# Patient Record
Sex: Female | Born: 1955 | Race: White | Hispanic: No | Marital: Married | Smoking: Never smoker
Health system: Southern US, Community
[De-identification: ages and names within clinical notes are randomized; demographics above are authoritative.]

## PROBLEM LIST (undated history)

## (undated) DIAGNOSIS — I219 Acute myocardial infarction, unspecified: Secondary | ICD-10-CM

---

## 2004-07-23 ENCOUNTER — Emergency Department: Payer: Self-pay | Admitting: Unknown Physician Specialty

## 2007-01-24 ENCOUNTER — Ambulatory Visit: Payer: Self-pay | Admitting: Gastroenterology

## 2007-02-14 ENCOUNTER — Ambulatory Visit: Payer: Self-pay | Admitting: Gastroenterology

## 2008-01-30 ENCOUNTER — Ambulatory Visit: Payer: Self-pay | Admitting: Unknown Physician Specialty

## 2008-05-18 ENCOUNTER — Emergency Department: Payer: Self-pay

## 2008-11-24 ENCOUNTER — Ambulatory Visit: Payer: Self-pay | Admitting: Urology

## 2009-02-16 ENCOUNTER — Ambulatory Visit: Payer: Self-pay | Admitting: Internal Medicine

## 2009-09-06 ENCOUNTER — Ambulatory Visit: Payer: Self-pay | Admitting: Internal Medicine

## 2009-11-23 ENCOUNTER — Ambulatory Visit: Payer: Self-pay | Admitting: General Practice

## 2010-02-17 ENCOUNTER — Ambulatory Visit: Payer: Self-pay | Admitting: Unknown Physician Specialty

## 2010-03-07 ENCOUNTER — Ambulatory Visit: Payer: Self-pay | Admitting: Internal Medicine

## 2010-06-15 DIAGNOSIS — I2510 Atherosclerotic heart disease of native coronary artery without angina pectoris: Secondary | ICD-10-CM | POA: Insufficient documentation

## 2010-06-15 DIAGNOSIS — E039 Hypothyroidism, unspecified: Secondary | ICD-10-CM | POA: Insufficient documentation

## 2010-06-15 DIAGNOSIS — I219 Acute myocardial infarction, unspecified: Secondary | ICD-10-CM | POA: Insufficient documentation

## 2010-07-04 ENCOUNTER — Ambulatory Visit: Payer: Self-pay | Admitting: Internal Medicine

## 2010-07-10 ENCOUNTER — Emergency Department: Payer: Self-pay | Admitting: Emergency Medicine

## 2010-11-29 ENCOUNTER — Ambulatory Visit: Payer: Self-pay | Admitting: Urology

## 2011-01-24 ENCOUNTER — Ambulatory Visit: Payer: Self-pay | Admitting: Family Medicine

## 2011-07-25 ENCOUNTER — Ambulatory Visit: Payer: Self-pay | Admitting: Internal Medicine

## 2015-12-31 DIAGNOSIS — H9313 Tinnitus, bilateral: Secondary | ICD-10-CM | POA: Insufficient documentation

## 2017-05-30 ENCOUNTER — Other Ambulatory Visit: Payer: Self-pay | Admitting: Pediatrics

## 2017-05-30 DIAGNOSIS — Z1231 Encounter for screening mammogram for malignant neoplasm of breast: Secondary | ICD-10-CM

## 2017-06-06 ENCOUNTER — Encounter: Payer: Self-pay | Admitting: Radiology

## 2017-06-06 ENCOUNTER — Ambulatory Visit
Admission: RE | Admit: 2017-06-06 | Discharge: 2017-06-06 | Disposition: A | Discharge status: Home / Self Care | Source: Ambulatory Visit | Attending: Pediatrics | Admitting: Pediatrics

## 2017-06-06 DIAGNOSIS — Z1231 Encounter for screening mammogram for malignant neoplasm of breast: Secondary | ICD-10-CM

## 2017-06-07 ENCOUNTER — Other Ambulatory Visit: Payer: Self-pay | Admitting: *Deleted

## 2017-06-07 ENCOUNTER — Inpatient Hospital Stay
Admission: RE | Admit: 2017-06-07 | Discharge: 2017-06-07 | Disposition: A | Discharge status: Home / Self Care | Payer: Self-pay | Source: Ambulatory Visit | Attending: *Deleted | Admitting: *Deleted

## 2017-06-07 DIAGNOSIS — Z9289 Personal history of other medical treatment: Secondary | ICD-10-CM

## 2017-09-05 ENCOUNTER — Ambulatory Visit
Admission: EM | Admit: 2017-09-05 | Discharge: 2017-09-05 | Disposition: A | Discharge status: Home / Self Care | Attending: Family Medicine | Admitting: Family Medicine

## 2017-09-05 ENCOUNTER — Ambulatory Visit (INDEPENDENT_AMBULATORY_CARE_PROVIDER_SITE_OTHER)

## 2017-09-05 DIAGNOSIS — T310 Burns involving less than 10% of body surface: Secondary | ICD-10-CM

## 2017-09-05 DIAGNOSIS — X101XXA Contact with hot food, initial encounter: Secondary | ICD-10-CM

## 2017-09-05 DIAGNOSIS — T2102XA Burn of unspecified degree of abdominal wall, initial encounter: Secondary | ICD-10-CM

## 2017-09-05 DIAGNOSIS — S99922A Unspecified injury of left foot, initial encounter: Secondary | ICD-10-CM

## 2017-09-05 DIAGNOSIS — M79672 Pain in left foot: Secondary | ICD-10-CM

## 2017-09-05 DIAGNOSIS — T25022A Burn of unspecified degree of left foot, initial encounter: Secondary | ICD-10-CM

## 2017-09-05 HISTORY — DX: Acute myocardial infarction, unspecified: I21.9

## 2017-09-05 MED ORDER — KETOROLAC TROMETHAMINE 60 MG/2ML IM SOLN
60.0000 mg | Freq: Once | INTRAMUSCULAR | Status: AC
  Administered 2017-09-05: 60 mg via INTRAMUSCULAR

## 2017-09-05 MED ORDER — SILVER SULFADIAZINE 1 % EX CREA
TOPICAL_CREAM | Freq: Two times a day (BID) | CUTANEOUS | Status: DC
  Administered 2017-09-05: 20:00:00 via TOPICAL

## 2017-09-05 MED ORDER — TRAMADOL HCL 50 MG PO TABS: 50.0000 mg | ORAL_TABLET | Freq: Four times a day (QID) | ORAL | 0 refills | Status: AC | PRN

## 2017-09-05 MED ORDER — SILVER SULFADIAZINE 1 % EX CREA: 1.0000 "application " | TOPICAL_CREAM | Freq: Every day | CUTANEOUS | 0 refills | Status: AC
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
## 2018-05-15 ENCOUNTER — Other Ambulatory Visit: Payer: Self-pay | Admitting: Pediatrics

## 2018-05-15 DIAGNOSIS — Z1231 Encounter for screening mammogram for malignant neoplasm of breast: Secondary | ICD-10-CM

## 2018-06-10 ENCOUNTER — Encounter (INDEPENDENT_AMBULATORY_CARE_PROVIDER_SITE_OTHER): Payer: Self-pay

## 2018-06-10 ENCOUNTER — Ambulatory Visit
Admission: RE | Admit: 2018-06-10 | Discharge: 2018-06-10 | Disposition: A | Discharge status: Home / Self Care | Source: Ambulatory Visit | Attending: Pediatrics | Admitting: Pediatrics

## 2018-06-10 DIAGNOSIS — Z1231 Encounter for screening mammogram for malignant neoplasm of breast: Secondary | ICD-10-CM | POA: Diagnosis not present

## 2018-06-11 ENCOUNTER — Other Ambulatory Visit: Payer: Self-pay | Admitting: Pediatrics

## 2018-06-13 ENCOUNTER — Other Ambulatory Visit: Payer: Self-pay | Admitting: Pediatrics

## 2018-06-13 DIAGNOSIS — N6320 Unspecified lump in the left breast, unspecified quadrant: Secondary | ICD-10-CM

## 2018-06-13 DIAGNOSIS — R928 Other abnormal and inconclusive findings on diagnostic imaging of breast: Secondary | ICD-10-CM

## 2018-06-20 ENCOUNTER — Ambulatory Visit
Admission: RE | Admit: 2018-06-20 | Discharge: 2018-06-20 | Disposition: A | Discharge status: Home / Self Care | Source: Ambulatory Visit | Attending: Pediatrics | Admitting: Pediatrics

## 2018-06-20 DIAGNOSIS — R928 Other abnormal and inconclusive findings on diagnostic imaging of breast: Secondary | ICD-10-CM | POA: Insufficient documentation

## 2018-06-20 DIAGNOSIS — N6320 Unspecified lump in the left breast, unspecified quadrant: Secondary | ICD-10-CM | POA: Diagnosis present

## 2019-06-17 ENCOUNTER — Other Ambulatory Visit: Payer: Self-pay | Admitting: Pediatrics

## 2019-06-26 ENCOUNTER — Other Ambulatory Visit: Payer: Self-pay | Admitting: Pediatrics

## 2019-06-26 DIAGNOSIS — Z1231 Encounter for screening mammogram for malignant neoplasm of breast: Secondary | ICD-10-CM

## 2019-07-07 ENCOUNTER — Encounter (INDEPENDENT_AMBULATORY_CARE_PROVIDER_SITE_OTHER): Payer: Self-pay

## 2019-07-07 ENCOUNTER — Ambulatory Visit
Admission: RE | Admit: 2019-07-07 | Discharge: 2019-07-07 | Disposition: A | Discharge status: Home / Self Care | Source: Ambulatory Visit | Attending: Pediatrics | Admitting: Pediatrics

## 2019-07-07 ENCOUNTER — Other Ambulatory Visit: Payer: Self-pay

## 2019-07-07 DIAGNOSIS — Z1231 Encounter for screening mammogram for malignant neoplasm of breast: Secondary | ICD-10-CM

## 2020-04-18 IMAGING — MG DIGITAL SCREENING BILATERAL MAMMOGRAM WITH TOMO AND CAD
8 series · 8 of 24 positions shown · non-contrast
Comparison: Previous exam(s).

CLINICAL DATA: Screening.

EXAM:
DIGITAL SCREENING BILATERAL MAMMOGRAM WITH TOMO AND CAD

[L CC synth-2D]
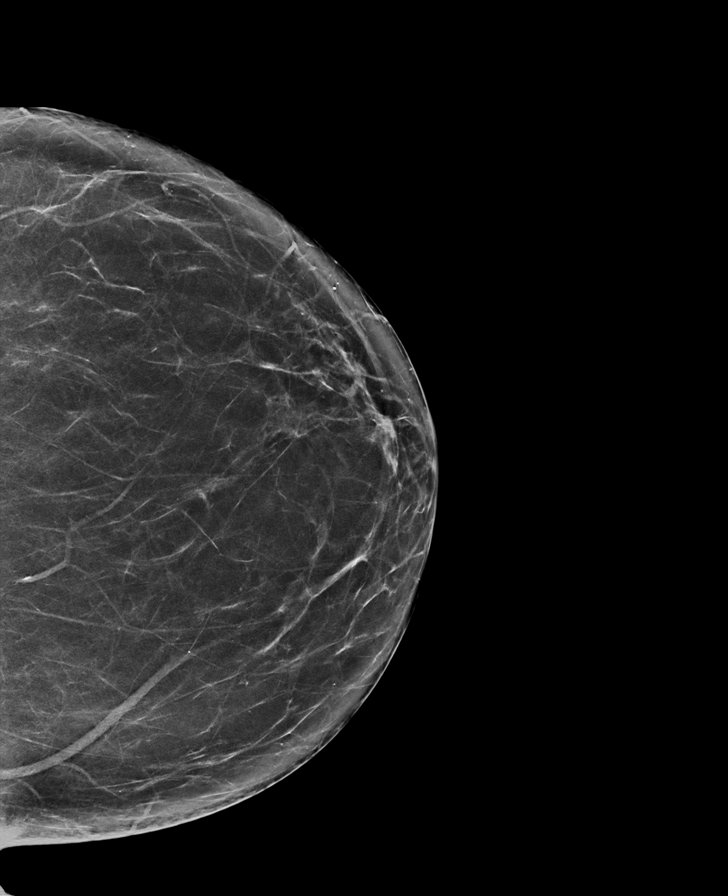

[R MLO synth-2D]
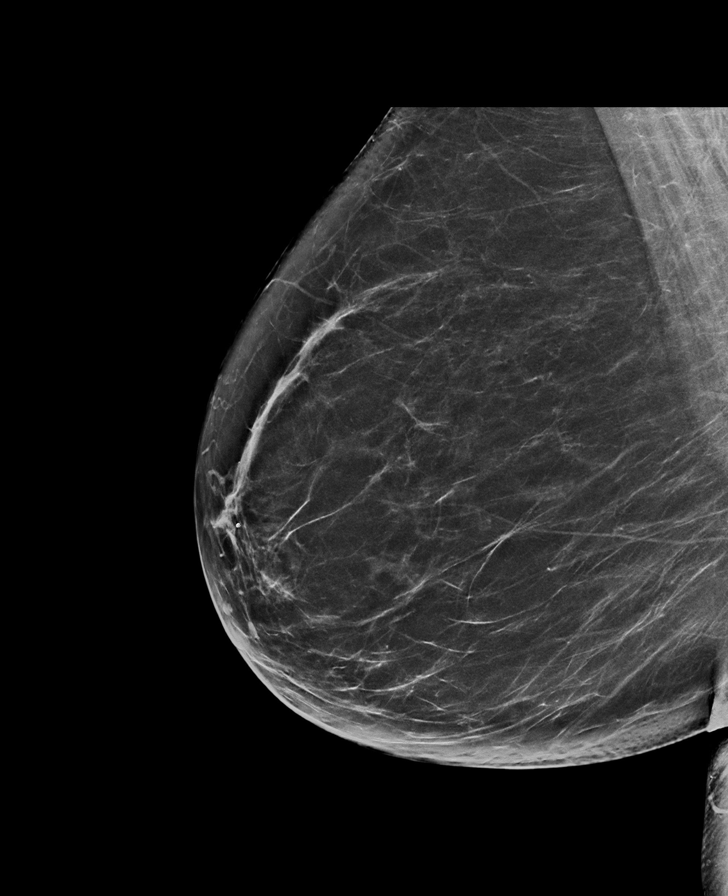

[L MLO synth-2D]
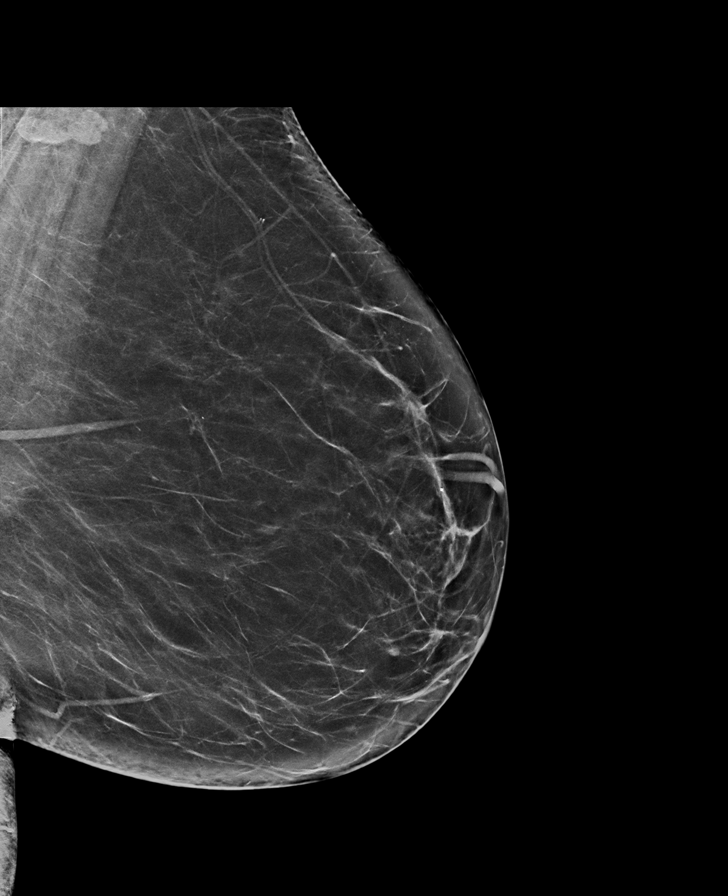

[R CC synth-2D]
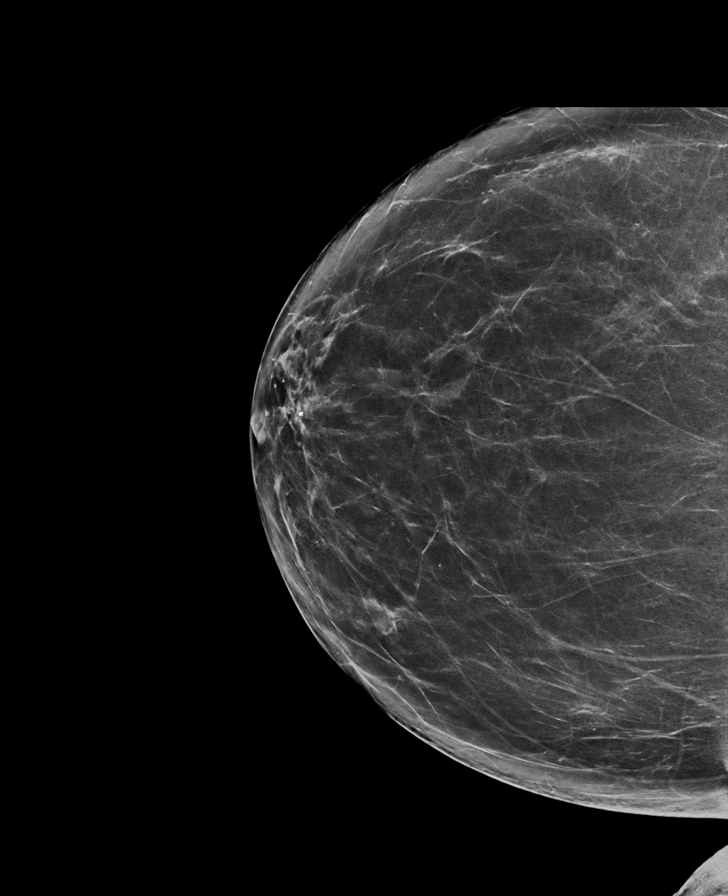

[L CC tomo · tomo slice 37/72.0]
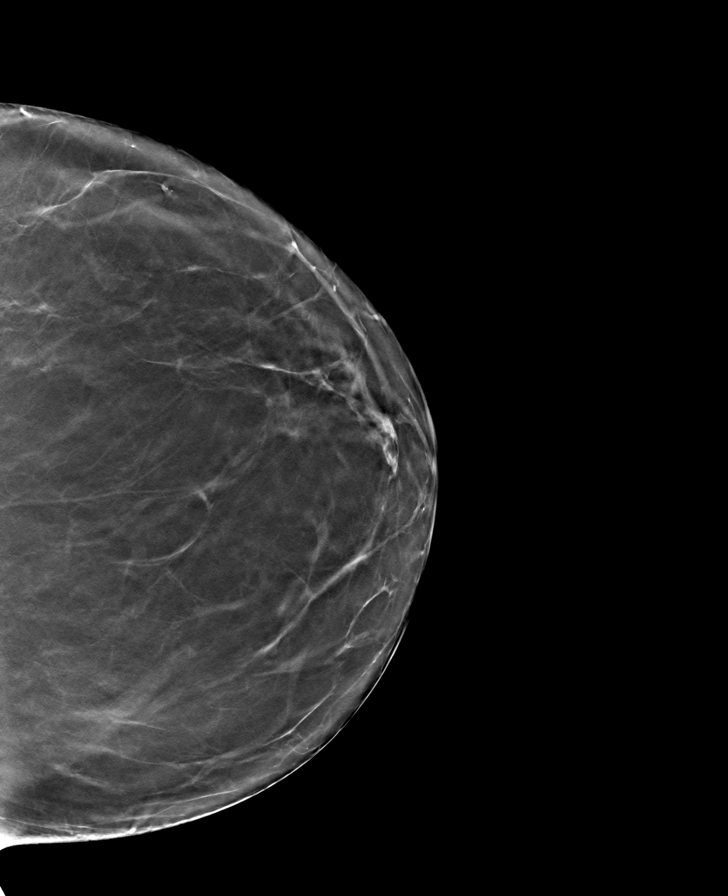

[R MLO tomo · tomo slice 41/82.0]
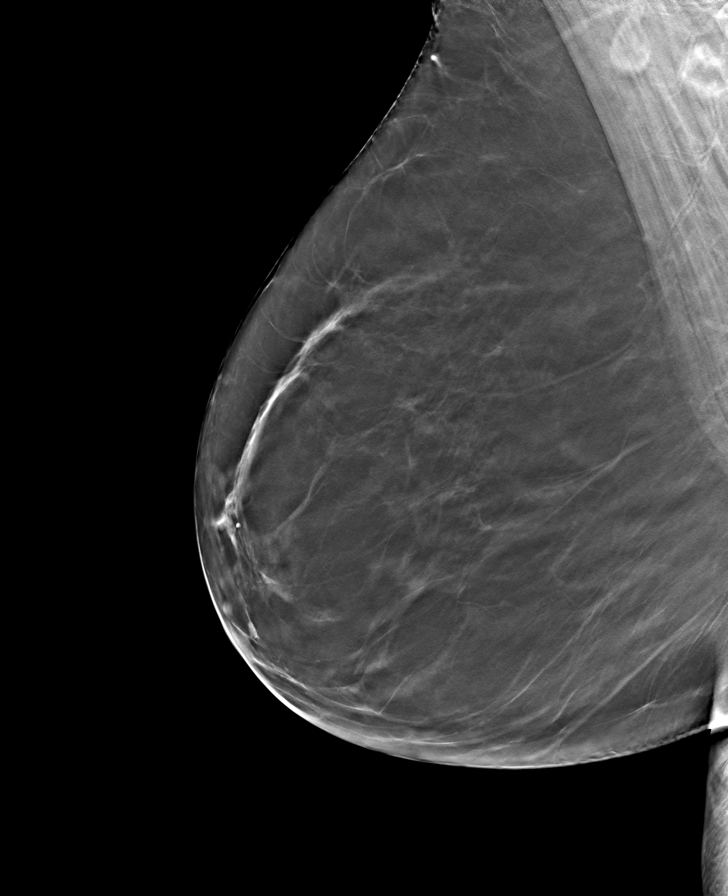

[R CC tomo · tomo slice 39/78.0]
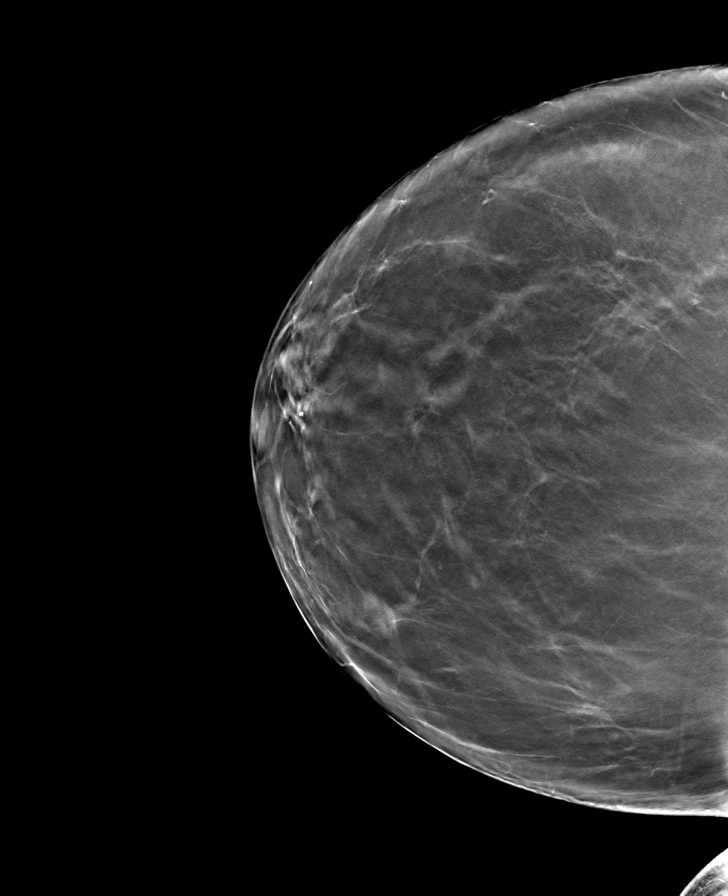

[L MLO tomo · tomo slice 40/79.0]
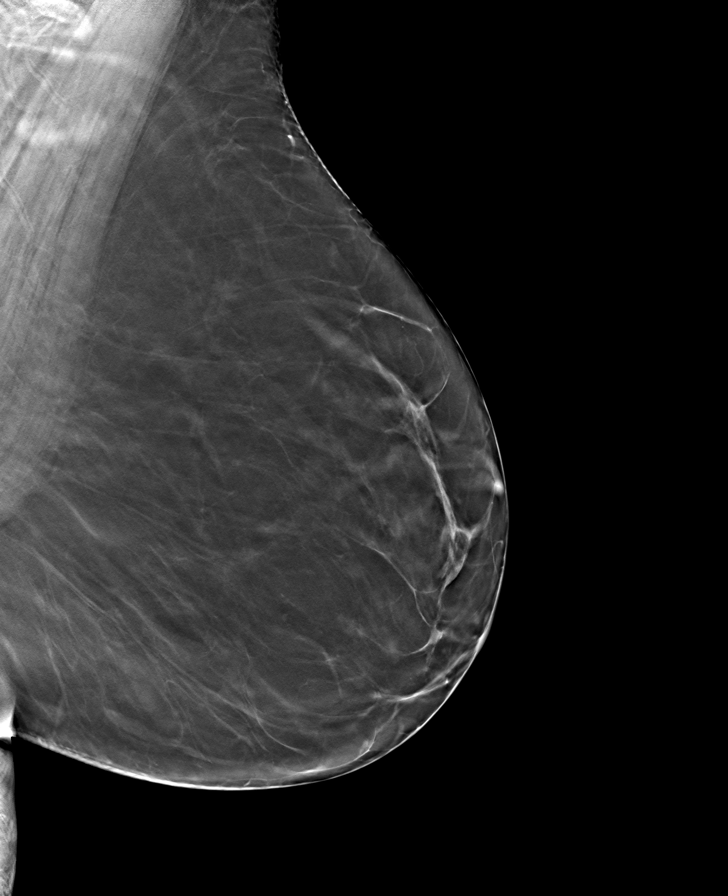

[8 of 24 positions shown; findings below may reference images not displayed]

ACR Breast Density Category b: There are scattered areas of
fibroglandular density.
FINDINGS: In the left breast, a possible mass warrants further evaluation. In
the right breast, no findings suspicious for malignancy. Images were
processed with CAD.
IMPRESSION: Further evaluation is suggested for possible mass in the left
breast.

RECOMMENDATION:
Ultrasound of the left breast. (Code:F4-8-550)

The patient will be contacted regarding the findings, and additional
imaging will be scheduled.

BI-RADS CATEGORY  0: Incomplete. Need additional imaging evaluation
and/or prior mammograms for comparison.

## 2020-06-10 ENCOUNTER — Other Ambulatory Visit: Payer: Self-pay | Admitting: Pediatrics

## 2020-06-10 DIAGNOSIS — Z1231 Encounter for screening mammogram for malignant neoplasm of breast: Secondary | ICD-10-CM

## 2020-07-08 ENCOUNTER — Ambulatory Visit
Admission: RE | Admit: 2020-07-08 | Discharge: 2020-07-08 | Disposition: A | Discharge status: Home / Self Care | Source: Ambulatory Visit | Attending: Pediatrics | Admitting: Pediatrics

## 2020-07-08 ENCOUNTER — Other Ambulatory Visit: Payer: Self-pay

## 2020-07-08 DIAGNOSIS — Z1231 Encounter for screening mammogram for malignant neoplasm of breast: Secondary | ICD-10-CM | POA: Insufficient documentation

## 2021-06-09 ENCOUNTER — Other Ambulatory Visit: Payer: Self-pay | Admitting: Pediatrics

## 2021-06-09 DIAGNOSIS — Z1231 Encounter for screening mammogram for malignant neoplasm of breast: Secondary | ICD-10-CM

## 2021-08-03 ENCOUNTER — Ambulatory Visit
Admission: RE | Admit: 2021-08-03 | Discharge: 2021-08-03 | Disposition: A | Discharge status: Home / Self Care | Source: Ambulatory Visit | Attending: Pediatrics | Admitting: Pediatrics

## 2021-08-03 DIAGNOSIS — Z1231 Encounter for screening mammogram for malignant neoplasm of breast: Secondary | ICD-10-CM | POA: Diagnosis present

## 2022-10-06 ENCOUNTER — Other Ambulatory Visit: Payer: Self-pay | Admitting: Pediatrics

## 2022-10-06 DIAGNOSIS — Z1231 Encounter for screening mammogram for malignant neoplasm of breast: Secondary | ICD-10-CM

## 2022-10-30 ENCOUNTER — Ambulatory Visit
Admission: RE | Admit: 2022-10-30 | Discharge: 2022-10-30 | Disposition: A | Discharge status: Home / Self Care | Payer: Medicare Other | Source: Ambulatory Visit | Attending: Pediatrics | Admitting: Pediatrics

## 2022-10-30 DIAGNOSIS — Z1231 Encounter for screening mammogram for malignant neoplasm of breast: Secondary | ICD-10-CM | POA: Insufficient documentation

## 2023-10-03 ENCOUNTER — Other Ambulatory Visit: Payer: Self-pay | Admitting: Pediatrics

## 2023-10-03 DIAGNOSIS — Z1231 Encounter for screening mammogram for malignant neoplasm of breast: Secondary | ICD-10-CM

## 2023-11-15 ENCOUNTER — Ambulatory Visit
Admission: RE | Admit: 2023-11-15 | Discharge: 2023-11-15 | Disposition: A | Discharge status: Home / Self Care | Source: Ambulatory Visit | Attending: Pediatrics | Admitting: Pediatrics

## 2023-11-15 DIAGNOSIS — Z1231 Encounter for screening mammogram for malignant neoplasm of breast: Secondary | ICD-10-CM | POA: Insufficient documentation
